# Patient Record
Sex: Female | Born: 1990 | Hispanic: No
Health system: Southern US, Community
[De-identification: ages and names within clinical notes are randomized; demographics above are authoritative.]

---
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
## 2015-05-05 ENCOUNTER — Other Ambulatory Visit: Payer: Self-pay | Admitting: Family Medicine

## 2015-05-05 ENCOUNTER — Other Ambulatory Visit (HOSPITAL_COMMUNITY)
Admission: RE | Admit: 2015-05-05 | Discharge: 2015-05-05 | Disposition: A | Discharge status: Home / Self Care | Payer: PRIVATE HEALTH INSURANCE | Source: Ambulatory Visit | Attending: Family Medicine | Admitting: Family Medicine

## 2015-05-05 DIAGNOSIS — Z124 Encounter for screening for malignant neoplasm of cervix: Secondary | ICD-10-CM | POA: Insufficient documentation

## 2015-05-06 LAB — CYTOLOGY - PAP

## 2016-01-30 ENCOUNTER — Ambulatory Visit: Payer: PRIVATE HEALTH INSURANCE | Admitting: Family Medicine

## 2018-04-25 ENCOUNTER — Encounter

## 2018-04-25 ENCOUNTER — Encounter: Payer: Self-pay | Admitting: Sports Medicine

## 2018-04-25 ENCOUNTER — Ambulatory Visit (INDEPENDENT_AMBULATORY_CARE_PROVIDER_SITE_OTHER): Payer: PRIVATE HEALTH INSURANCE | Admitting: Sports Medicine

## 2018-04-25 VITALS — BP 98/66 | Ht 67.0 in | Wt 150.0 lb

## 2018-04-25 DIAGNOSIS — S76019A Strain of muscle, fascia and tendon of unspecified hip, initial encounter: Secondary | ICD-10-CM | POA: Insufficient documentation

## 2018-04-25 DIAGNOSIS — R269 Unspecified abnormalities of gait and mobility: Secondary | ICD-10-CM

## 2018-04-25 DIAGNOSIS — M25551 Pain in right hip: Secondary | ICD-10-CM | POA: Diagnosis not present

## 2018-04-25 DIAGNOSIS — M25552 Pain in left hip: Secondary | ICD-10-CM

## 2018-04-25 NOTE — Assessment & Plan Note (Addendum)
Work on Science writerline drills Not sure if this is 2/2 hip issues or may have caused them  Develop better biomechanical foot strike Will add insoles if needed  I spent 35 minutes with this patient. Over 50% of visit was spend in counseling and coordination of care for problems with bilateral hip pain

## 2018-04-25 NOTE — Progress Notes (Signed)
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 Caswell CorwinStubbs included x-ray and MRI Both of these show some mild hip dysplasia but preserved joint space Based on his evaluation Dr. Caswell CorwinStubbs felt she should consider surgery- PAO and should not return to running  Note cortisone injection was not helpful  Evaluation and sports medicine by Dr. Deanna Artishorton revealed that he considered the possibility of biomechanical issues and recommended a trial of therapy  Patient has been able to cross train and do some therapy without significant pain  Today she comes for my opinion She has done some running and starts with some high lateral hip pain bilaterally There is some soreness in both hips after running but it is confined to the area just below the iliac crest Note her MRI showed some edema of both gluteus medius muscles  She denies ever having any groin pain She never feels that there is a catch or a limitation in motion of her hip joint She does not get any nighttime pain  Pain is worse after standing for long time in the hospital Standing or long days in hospital hurt as much as running  Social history Patient is a Consulting civil engineerstudent at [employer] Mother is a physical therapist at [facility] Father is a professor [facility] [employer]  Review of systems Denies low back pain Denies sciatica No history of hip subluxations  Physical  examination Pleasant athletic female in no acute distress BP 98/66   Ht 5\' 7"  (1.702 m)   Wt 150 lb (68 kg)   BMI 23.49 kg/m   Full internal and external rotation of both hips Mild pain on the right superior lateral hip with rotation Neg FABER and FADIR signs for any pain TTP over gluteus medius on RT superior insertion to iliac crest and below Mildly TTP over left Glut Med Hip flexion strength is normal Hip abduction strength is weak bilaterally just on testing Glut med/ minimus Strong when recruiting glut max of TFL RT weaker than left   LL equal Alignment good Foot thin with preserved long arch  Running gait No limp or trendelenburg She has external rotation of foot bilaterally on foot strike This causes excess Pelvic rotation  MRI reviewed and XR reviewed  Changes of dysplasia are mild I do not think her degree of FAI is significant Mild edema on MRI in gluteus medius bilat. No DJD of hips  US of bilateral hips There is no tear noted in gluteus medius or minimus bilaterally Insertion of tendons to greater trochanter is normal Mild hypoechoic change noted RT > LT at intersection of gluteus medius and minimus bilaterally;  This is consistent with MRI finding No excess doppler flow  Impression: Chronic Gluteus medius strain  Ultrasound and interpretation by Sibyl ParrKarl B. Darrick PennaFields, MD

## 2018-04-25 NOTE — Assessment & Plan Note (Signed)
I advised that I did not think the degree of hip dysplasia explained her pain pattern She needs to rehab sufficiently to gain full glut. Med strength Once she has done a couple of weeks of strength work ease in to some running starting at 20 mins qod [employer] on alternate days Ice after activity  Reck 6 weeks

## 2018-06-13 ENCOUNTER — Ambulatory Visit: Payer: PRIVATE HEALTH INSURANCE | Admitting: Sports Medicine

## 2019-04-04 DEATH — deceased
